# Patient Record
Sex: Female | Born: 1995 | Race: White | Hispanic: No | Marital: Single
Health system: Southern US, Community
[De-identification: ages and names within clinical notes are randomized; demographics above are authoritative.]

---
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
## 2014-01-08 IMAGING — US US OB DETAIL+14 WK - NRPT [facility]
1 series · 14 of 28 positions shown · non-contrast
Comparison: none

[Series 1: us ob detail+14 wk - nrpt [facility] · 0.21mm/px · 14 of 82 slices shown]
[im 4/82]
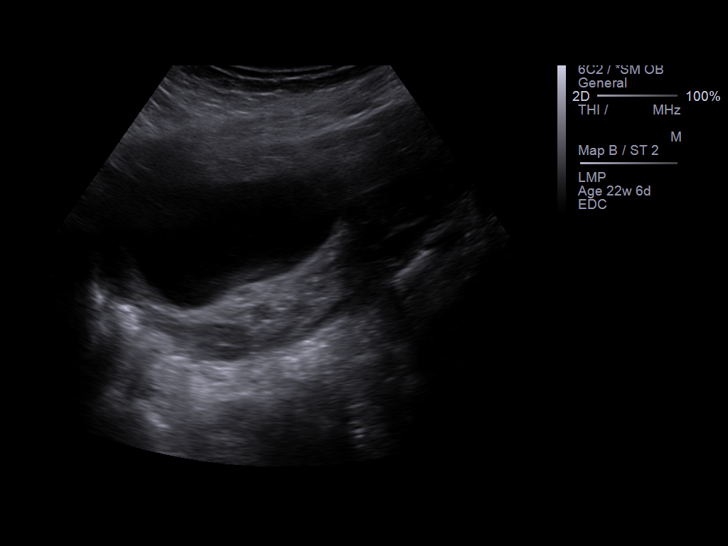
[im 10/82]
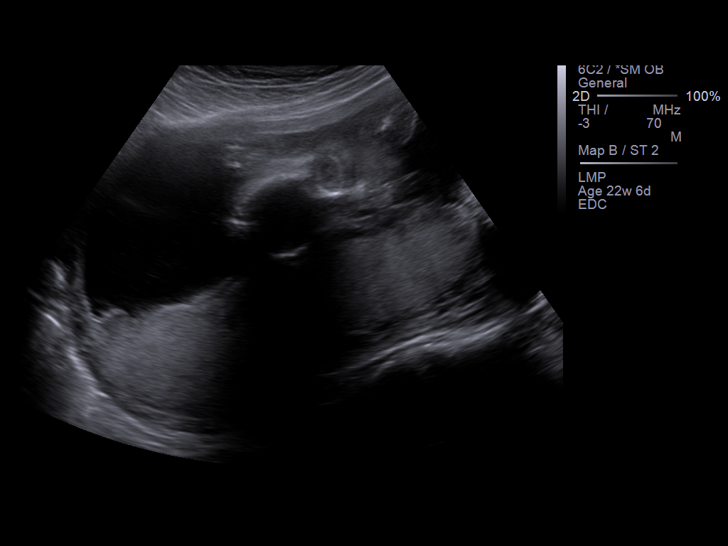
[im 16/82]
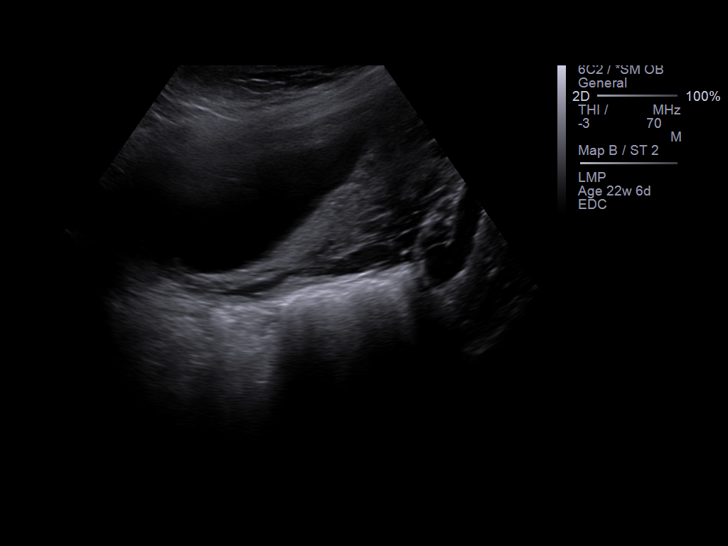
[im 22/82]
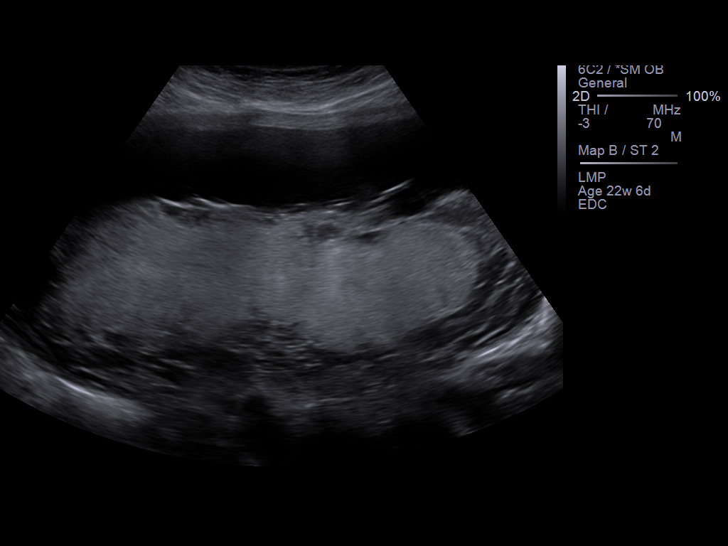
[im 28/82]
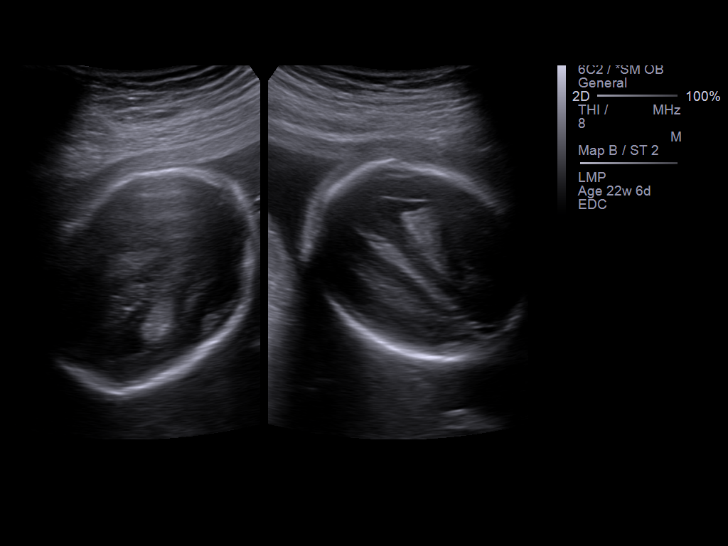
[im 34/82]
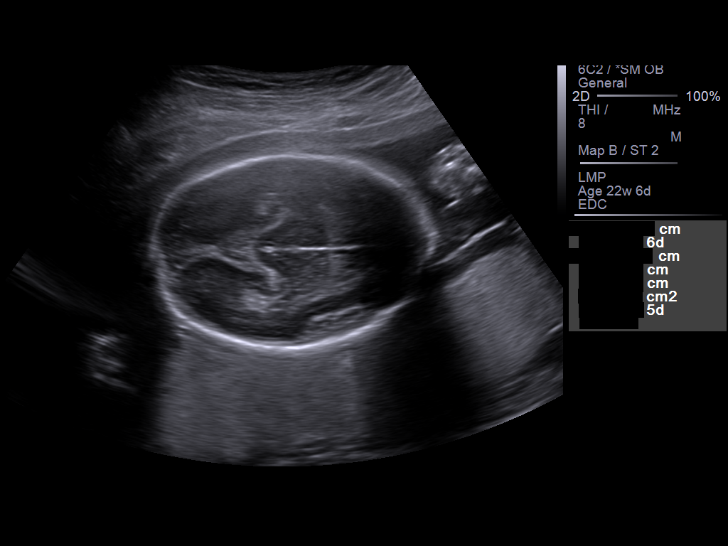
[im 40/82]
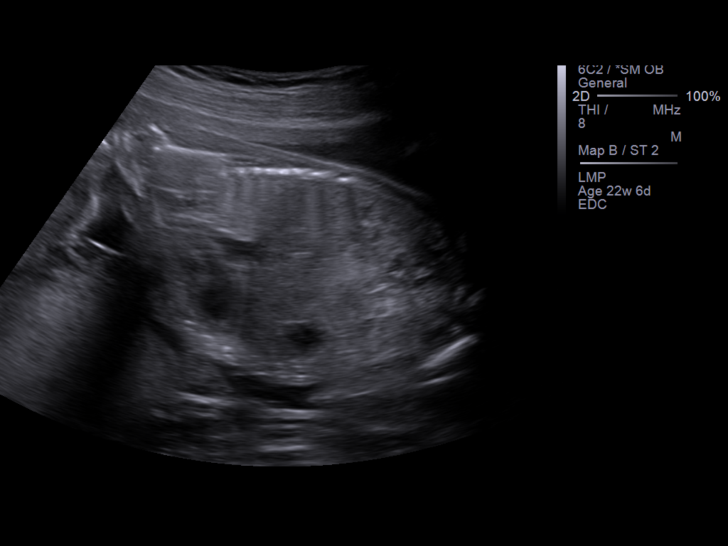
[im 46/82]
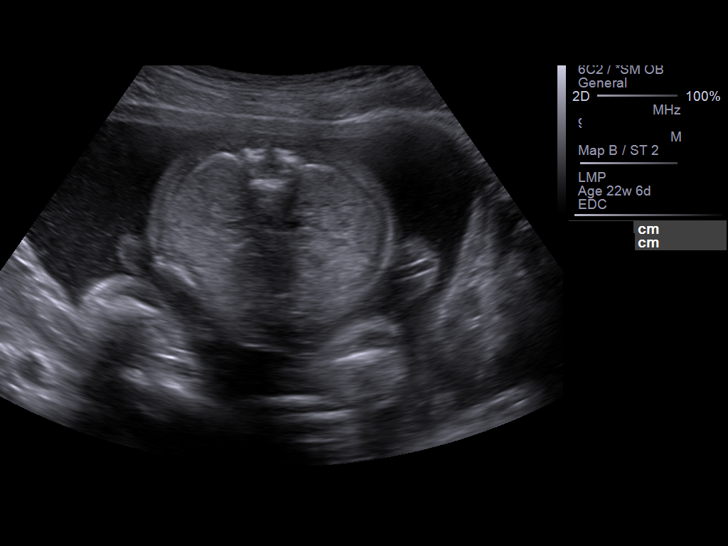
[im 52/82]
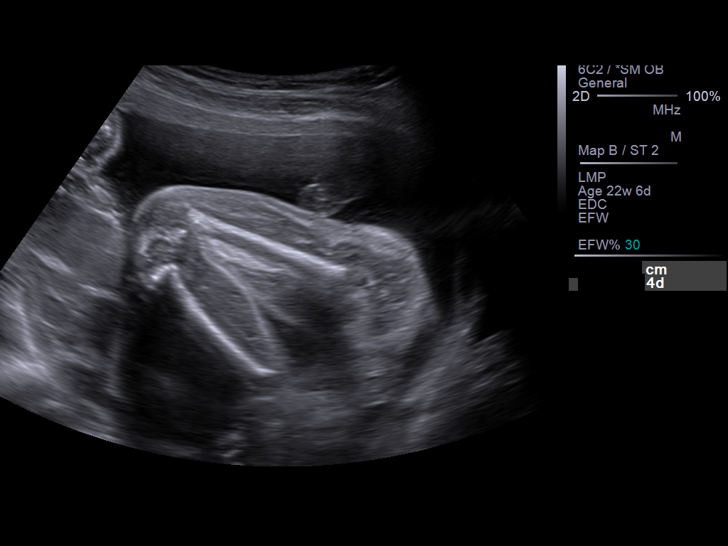
[im 58/82]
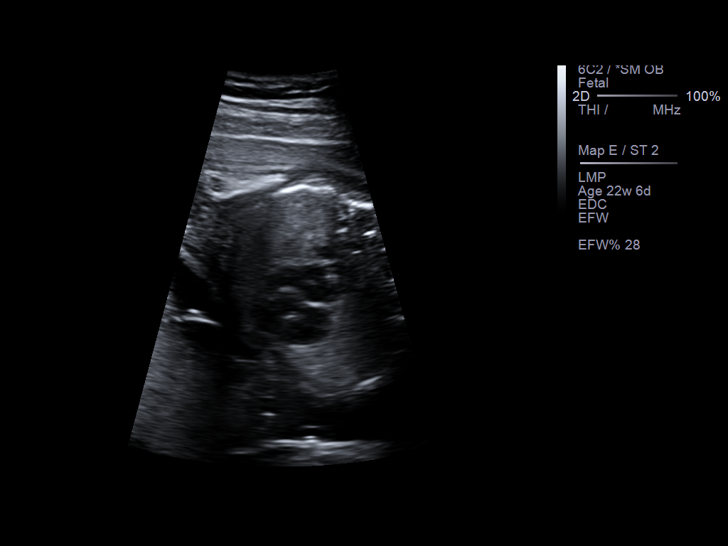
[im 64/82]
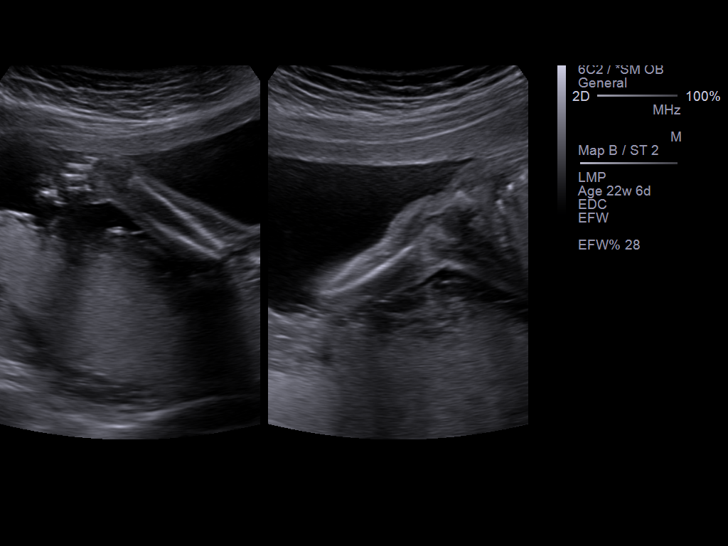
[im 70/82]
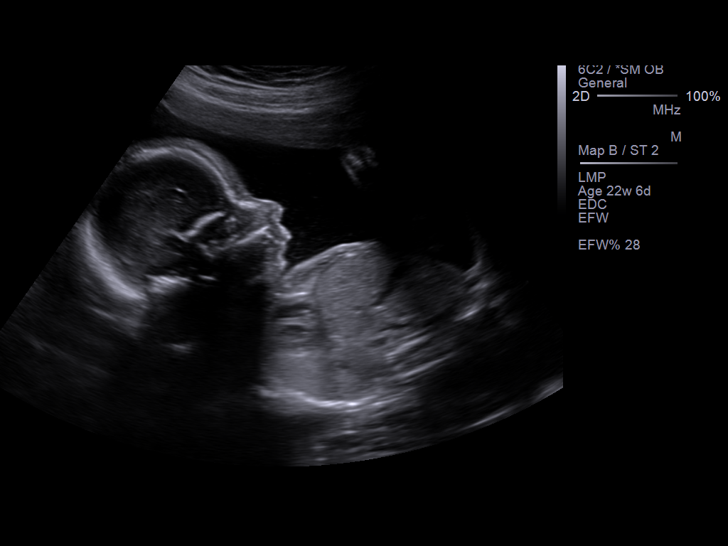
[im 76/82]
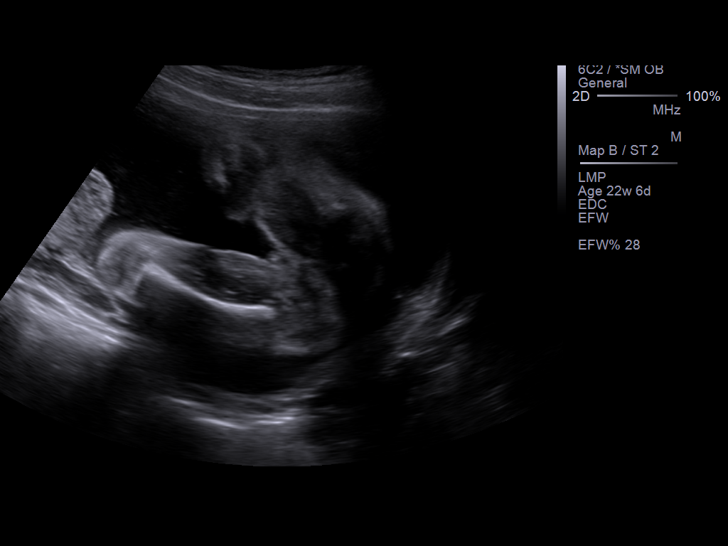
[im 82/82]
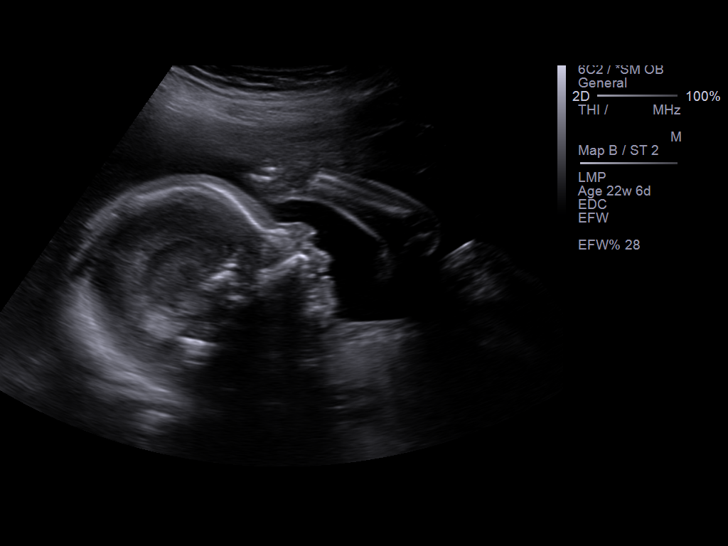

[14 of 28 positions shown; findings below may reference images not displayed]

IMAGES IMPORTED FROM THE SYN[employer] WORKFLOW SYSTEM
NO DICTATION FOR STUDY

## 2014-05-08 NOTE — Consult Note (Signed)
PATIENT NAME:  Cristina Foster, [name] MR#:  [id number] DATE OF BIRTH:  April 02, 1995  DATE OF CONSULTATION:  02/06/2013  REQUESTING PHYSICIAN: Acquanetta BellingAngela Lugiano, CNM  CONSULTING PHYSICIAN:  Scott C. Stoioff, MD  REASON FOR CONSULTATION: Right flank pain and hydronephrosis.   CHIEF COMPLAINT: Back pain.   HISTORY OF PRESENT ILLNESS: This 19 year old G1 at 28/2 weeks, [facility], presented to the Emergency Department today with onset of severe right flank pain this morning. She has had some nausea without vomiting. She was seen earlier this week complaining of bladder pressure and started on Macrobid for a UTI. Urinalysis at that visit showed 3+ leukocytes and 8 WBCs; however, a urine culture grew mixed flora. A renal ultrasound was performed in the Emergency Department which showed right hydronephrosis and hydroureter. She was also incidentally noted to have gallstones. She states she was told she had a kidney stone approximately 2 years ago. She thinks she passed the stone and has had no problems since that time. She does have urinary frequency and urgency.   PAST MEDICAL HISTORY: She is healthy without chronic medical illnesses.   PAST SURGICAL HISTORY: None.   MEDICATIONS ON ADMISSION: Prenatal vitamins.   ALLERGIES: NKDA.   REVIEW OF SYSTEMS: Otherwise noncontributory except as per the HPI.  PHYSICAL EXAMINATION:  VITAL SIGNS: Temp was 98, BP 119/65, pulse 82.  GENERAL: In mild distress secondary to right flank pain.  ABDOMEN: Soft, gravid, nontender.  BACK: With mild right CVA tenderness.   DIAGNOSTIC DATA: Admission urinalysis remarkable for 26 RBCs, 25 WBCs;  however, there are 35 squamous epithelial cells present.   Renal ultrasound was reviewed and there is moderate right hydronephrosis and hydroureter. On the images provided, the ureter can be followed down to the bladder and a definite stone is not seen. She was incidentally noted to have cholelithiasis.   IMPRESSION: Right flank  pain - her hydronephrosis is on the right and could be hydronephrosis of pregnancy. The possibility of a ureteral calculus cannot be completely excluded. She was treated for a urinary tract infection earlier this week with Macrobid which does not achieve tissue penetration. Consideration of nonneurologic pain with her cholelithiasis also needs to be considered.   RECOMMENDATION:  1.  Oral/IV analgesics as needed.  2.  Would change her antibiotics to a cephalosporin.  3.  I will follow.   ____________________________ Verna CzechScott C. Lonna CobbStoioff, MD scs:np D: 02/06/2013 18:29:28 ET T: 02/06/2013 19:03:13 ET JOB#: [id number]  cc: Lorin PicketScott C. Lonna CobbStoioff, MD, <Dictator> Riki AltesSCOTT C STOIOFF MD ELECTRONICALLY SIGNED 02/11/2013 14:34

## 2014-05-08 NOTE — Consult Note (Signed)
Chief Complaint:  Subjective/Chief Complaint Urology F/U Day #2 IV Cefoxitin  Pt without new complaints - endorses persistent, constant 8/10 stabbing right flank/lat abd pain, however, also endorses decreased nausea and has been able to tolerate PO's this morning.   Brief Assessment:  GEN well developed, well nourished, no acute distress   Respiratory normal resp effort   Additional Physical Exam Positive Right CVAT   Lab Results: Hepatic:  23-Jan-15 10:18   Bilirubin, Total 0.2  Alkaline Phosphatase 84 (45-117 NOTE: New Reference Range 12/05/12)  SGPT (ALT) 19  SGOT (AST) 26  Total Protein, Serum 7.0  Albumin, Serum  3.3  Routine Chem:  23-Jan-15 10:18   HCG Betasubunit Quant. Serum  11136 (1-3  (International Unit)  ----------------- Non-pregnant <5 Weeks Post LMP mIU/mL  3- 4 wk 9 - 130  4- 5 wk 75 - 2,600  5- 6 wk 850 - 20,800  6- 7 wk 4,000 - 100,000  7-12 wk 11,500 - 289,000 12-16 wk 18,000 - 137,000 16-29 wk 1,400 - 53,000 29-41 wk 940 - 60,000)  Lipase 145 (Result(s) reported on 06 Feb 2013 at 10:37AM.)  Glucose, Serum 79  BUN 8  Creatinine (comp) 0.62  Sodium, Serum  133  Potassium, Serum 4.0  Chloride, Serum 104  CO2, Serum 23  Calcium (Total), Serum  8.8  Osmolality (calc) 264  eGFR (African American) >60  eGFR (Non-African American) >60 (eGFR values <96m/min/1.73 m2 may be an indication of chronic kidney disease (CKD). Calculated eGFR is useful in patients with stable renal function. The eGFR calculation will not be reliable in acutely ill patients when serum creatinine is changing rapidly. It is not useful in  patients on dialysis. The eGFR calculation may not be applicable to patients at the low and high extremes of body sizes, pregnant women, and vegetarians.)  Anion Gap  6  Routine UA:  23-Jan-15 10:18   Color (UA) Amber  Clarity (UA) Cloudy  Glucose (UA) Negative  Bilirubin (UA) Negative  Ketones (UA) Negative  Specific Gravity (UA)  1.021  Blood (UA) 1+  pH (UA) 5.0  Protein (UA) 30 mg/dL  Nitrite (UA) Negative  Leukocyte Esterase (UA) 3+ (Result(s) reported on 06 Feb 2013 at 11:28AM.)  RBC (UA) 26 /HPF  WBC (UA) 25 /HPF  Bacteria (UA) TRACE  Epithelial Cells (UA) 35 /HPF  Mucous (UA) PRESENT  Budding Yeast (UA) PRESENT  Amorphous Crystal (UA) PRESENT (Result(s) reported on 06 Feb 2013 at 11:28AM.)  Routine Hem:  23-Jan-15 10:18   WBC (CBC)  14.8  RBC (CBC) 4.12  Hemoglobin (CBC) 13.7  Hematocrit (CBC) 40.3  Platelet Count (CBC) 189 (Result(s) reported on 06 Feb 2013 at 10:31AM.)  MCV 98  MCH 33.3  MCHC 34.0  RDW 12.5   Radiology Results: [place]    23-Jan-15 12:05, [place] Bilateral  [place] Bilateral   REASON FOR EXAM:    severe right flank pain  COMMENTS:       PROCEDURE: [place] - [place]  - Feb 06 2013 12:05PM     CLINICAL DATA:  Severe right flank pain.    EXAM:  RENAL/URINARY TRACT ULTRASOUND COMPLETE    COMPARISON:  None.    FINDINGS:  Right Kidney:  Length: 11.6 cm. Moderate hydronephrosis and hydroureter. Lower air  could not be imaged. Normal right renal echogenicity. No mass.    Left Kidney:    Length: 10.0 cm. Echogenicity within normal limits. No mass or  hydronephrosis visualized.    Bladder:  Appears normal for degree of bladder distention.    Gallstones are incidentally noted.     IMPRESSION:  Moderate right hydronephrosis and hydroureter. Although a stone was  not identified, a stone particularly in the distal ureter cannot be  excluded .      Electronically Signed    By: Marcello Moores  Register    On: 02/06/2013 12:16         Verified By: Osa Craver, M.D., MD   Assessment/Plan:  Invasive Device Daily Assessment of Necessity:  Does the patient currently have any of the following indwelling devices? none   Assessment/Plan:  Assessment Right Flank Pain - improving on abx's. ?Pyelonephritis +/- ureteral stone.  Recommendations:  No new  recommendations. Consider transvaginal US to better evaluate the distal right ureter, if condition worsens.   Electronic Signatures: Darcella Cheshire (MD)  (Signed 24-Jan-15 10:20)  Authored: Chief Complaint, Brief Assessment, Lab Results, Radiology Results, Assessment/Plan   Last Updated: 24-Jan-15 10:20 by Darcella Cheshire (MD)

## 2014-05-08 NOTE — Consult Note (Signed)
Brief Consult Note: Diagnosis: flank pain.   Patient was seen by consultant.   Consult note dictated.   Comments: Her hydronephrosis may be related to hydronephrosis of pregnancy.  The possibility of a ureteral calculus cannot be excluded.  She was also noted have cholelithiasis and would also consider a non-urologic source. Urinalysis obtained on admission has multiple squamous epithelial cells indicative of contamination.  Electronic Signatures: Riki AltesStoioff, Scott C (MD)  (Signed 23-Jan-15 18:31)  Authored: Brief Consult Note   Last Updated: 23-Jan-15 18:31 by Riki AltesStoioff, Scott C (MD)

## 2014-05-25 NOTE — H&P (Signed)
L&D Evaluation:  History:  HPI 19 y/o G1 @ 28/2wks [facility] sent to LD from the ER with complaints of severe right flank pain, treated with macrobid for UTI earlier this week. Denies contractions, leaking fluid or vaginal bleeding, baby is active. [facility] Prenatal records not available-requested.   Presents with back pain   Patient's Medical History No Chronic Illness   Patient's Surgical History none   Medications Pre Natal Vitamins   Allergies NKDA   Social History none   Family History Non-Contributory   ROS:  ROS All systems were reviewed.  HEENT, CNS, GI, GU, Respiratory, CV, Renal and Musculoskeletal systems were found to be normal.   Exam:  Vital Signs stable   Urine Protein see lab   General other, Uncomfortable intermittent right flank pain  denies  UTI sx.   Mental Status clear   Chest clear   Heart normal sinus rhythm   Abdomen gravid, non-tender   Estimated Fetal Weight Average for gestational age   Fundal Height appropriate   Back Right + CVAT   Edema no edema   Reflexes 2+   Clonus negative   Pelvic no SVE   Mebranes Intact   FHT normal rate with no decels, Reactive NST 130's 140's baseline with accels +FM   Fetal Heart Rate 132   Ucx absent   Skin dry   Lymph no lymphadenopathy   Impression:  Impression 28wks Righ flank pain Hydronephrosis   Plan:  Plan monitor contractions and for cervical change, See below   Comments Given morphine 4mg  IV x 3 doses in ER, no pain medicine requested since up on LD @ 1600 (feeling better per pt) Dr Feliberto GottronSchermerhorn consulted byER MD Dr Mayford KnifeWIlliams and myself. Dr Virl DiamondStoiff here for consult. Plan to change ABX to Cephalosporin give RX analgesics and follow up in their office. See MD note. Moderate right hydronephrosis, labs, US's reviewed. Discussed S/SX and preventatives UTI and Preterm labor. [facility].   Electronic  Signatures: Albertina ParrLugiano, Makaelyn Aponte B (CNM)  (Signed 23-Jan-15 17:47)  Authored: L&D Evaluation   Last Updated: 23-Jan-15 17:47 by Albertina ParrLugiano, Briellah Baik B (CNM)

## 2014-05-25 NOTE — H&P (Signed)
L&D Evaluation:  History:  HPI 19 yo G1P0 with LMP of 06/29/12 & EDd of 04/05/13 here for c/o "vaginal bleeding on a tissue this pm", no hx of VB with this preg. PNC at [facility] significant for UTI, FOB has FMH of PKU, Tdap out of date, no ROM, no decreased FM or UC's. No US report in the chart,.   Presents with vaginal bleeding   Patient's Medical History UTI   Medications Pre Natal Vitamins   Allergies NKDA   Family History Non-Contributory   ROS:  ROS All systems were reviewed.  HEENT, CNS, GI, GU, Respiratory, CV, Renal and Musculoskeletal systems were found to be normal.   Exam:  Vital Signs stable   Urine Protein not completed   General no apparent distress   Mental Status clear   Chest clear   Heart normal sinus rhythm, no murmur/gallop/rubs   Abdomen gravid, non-tender   Estimated Fetal Weight Average for gestational age   Back no CVAT   Edema no edema   FHT 1 varuable, Age appropriate NST   Ucx absent   Skin dry   Lymph no lymphadenopathy   Impression:  Impression IUP at 31 weeks with VB not seen in [facility]   Plan:  Plan DC home   Comments Vag exam per sterile glove, Cx post, closed and non-effaced, No blood on glove, Advised pelvic rest x 3 days and FU at [facility] or return for worseing of S/S.   Electronic Signatures: Sharee PimpleJones, Aalani Aikens W (CNM)  (Signed 11-Feb-15 22:24)  Authored: L&D Evaluation   Last Updated: 11-Feb-15 22:24 by Sharee PimpleJones, Lamere Lightner W (CNM)

## 2014-05-25 NOTE — H&P (Signed)
L&D Evaluation:  History:  HPI 19 yo GP0 with LMP of ? and April 28, 2013 [place] [facility] [facility] and [facility]. Pt denies any PMH and presented here due to "lower pelvic discomfort since 1300 today", No LOF, No Uc's, No back pain, Normal Vag dc per pt. FM noted.   Presents with Pelvic discomfort   Patient's Medical History No Chronic Illness   Patient's Surgical History Tonsillectomy   Medications Pre Natal Vitamins   Allergies NKDA   Social History none   Family History Non-Contributory   ROS:  ROS All systems were reviewed.  HEENT, CNS, GI, GU, Respiratory, CV, Renal and Musculoskeletal systems were found to be normal.   Exam:  Vital Signs BP 105/57   General no apparent distress   Mental Status clear   Chest clear   Heart normal sinus rhythm, no murmur/gallop/rubs   Abdomen gravid, non-tender   Estimated Fetal Weight Average for gestational age   Fetal Position Breech   Back no CVAT   Edema 1+   Reflexes 1+   Clonus negative   Pelvic Cx ext ZO:[id number]:dimple, int os closed   Mebranes Intact   FHT normal rate with no decels, Age appropriate accels, Cat I, no DEcels   Ucx absent   Skin dry   Lymph no lymphadenopathy   Impression:  Impression IUP at 28 weeks with pelvic pressure   Plan:  Plan monitor contractions and for cervical change, Urine pending   Comments Pt has not felt UC's since arrival. no B-H's palp. No VB noted.   Electronic Signatures: Sharee PimpleJones, Caron W (CNM)  (Signed 21-Jan-15 22:52)  Authored: L&D Evaluation   Last Updated: 21-Jan-15 22:52 by Sharee PimpleJones, Caron W (CNM)
